# Patient Record
Sex: Male | Born: 1990 | Race: White | Hispanic: No | Marital: Married | Smoking: Never smoker
Health system: Southern US, Community
[De-identification: ages and names within clinical notes are randomized; demographics above are authoritative.]

## PROBLEM LIST (undated history)

## (undated) DIAGNOSIS — F4320 Adjustment disorder, unspecified: Secondary | ICD-10-CM

## (undated) DIAGNOSIS — Z8619 Personal history of other infectious and parasitic diseases: Secondary | ICD-10-CM

## (undated) DIAGNOSIS — L709 Acne, unspecified: Secondary | ICD-10-CM

## (undated) HISTORY — DX: Personal history of other infectious and parasitic diseases: Z86.19

## (undated) HISTORY — DX: Adjustment disorder, unspecified: F43.20

## (undated) HISTORY — DX: Acne, unspecified: L70.9

## (undated) HISTORY — PX: NO PAST SURGERIES: SHX2092

---
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
## 2019-08-20 ENCOUNTER — Other Ambulatory Visit: Payer: Self-pay

## 2019-08-20 ENCOUNTER — Emergency Department (HOSPITAL_COMMUNITY)
Admission: EM | Admit: 2019-08-20 | Discharge: 2019-08-20 | Disposition: A | Discharge status: Home / Self Care | Payer: No Typology Code available for payment source | Attending: Emergency Medicine | Admitting: Emergency Medicine

## 2019-08-20 ENCOUNTER — Emergency Department (HOSPITAL_COMMUNITY): Payer: No Typology Code available for payment source

## 2019-08-20 ENCOUNTER — Encounter (HOSPITAL_COMMUNITY): Payer: Self-pay | Admitting: Emergency Medicine

## 2019-08-20 DIAGNOSIS — Y990 Civilian activity done for income or pay: Secondary | ICD-10-CM | POA: Insufficient documentation

## 2019-08-20 DIAGNOSIS — Y9389 Activity, other specified: Secondary | ICD-10-CM | POA: Diagnosis not present

## 2019-08-20 DIAGNOSIS — S81811A Laceration without foreign body, right lower leg, initial encounter: Secondary | ICD-10-CM | POA: Diagnosis not present

## 2019-08-20 DIAGNOSIS — Z23 Encounter for immunization: Secondary | ICD-10-CM | POA: Insufficient documentation

## 2019-08-20 DIAGNOSIS — Z87891 Personal history of nicotine dependence: Secondary | ICD-10-CM | POA: Insufficient documentation

## 2019-08-20 DIAGNOSIS — W540XXA Bitten by dog, initial encounter: Secondary | ICD-10-CM | POA: Diagnosis not present

## 2019-08-20 DIAGNOSIS — Y9289 Other specified places as the place of occurrence of the external cause: Secondary | ICD-10-CM | POA: Insufficient documentation

## 2019-08-20 DIAGNOSIS — S81851A Open bite, right lower leg, initial encounter: Secondary | ICD-10-CM

## 2019-08-20 DIAGNOSIS — S71111A Laceration without foreign body, right thigh, initial encounter: Secondary | ICD-10-CM | POA: Insufficient documentation

## 2019-08-20 MED ORDER — LIDOCAINE-EPINEPHRINE (PF) 2 %-1:200000 IJ SOLN
20.0000 mL | Freq: Once | INTRAMUSCULAR | Status: AC
  Administered 2019-08-20: 20 mL via INTRADERMAL
  Filled 2019-08-20: qty 20

## 2019-08-20 MED ORDER — AMOXICILLIN-POT CLAVULANATE 875-125 MG PO TABS: 1.0000 | ORAL_TABLET | Freq: Two times a day (BID) | ORAL | 0 refills | Status: DC

## 2019-08-20 MED ORDER — IBUPROFEN 600 MG PO TABS: 600.0000 mg | ORAL_TABLET | Freq: Four times a day (QID) | ORAL | 0 refills | Status: DC | PRN

## 2019-08-20 MED ORDER — AMOXICILLIN-POT CLAVULANATE 875-125 MG PO TABS
1.0000 | ORAL_TABLET | Freq: Once | ORAL | Status: AC
  Administered 2019-08-20: 22:00:00 1 via ORAL
  Filled 2019-08-20: qty 1

## 2019-08-20 MED ORDER — TETANUS-DIPHTH-ACELL PERTUSSIS 5-2.5-18.5 LF-MCG/0.5 IM SUSP
0.5000 mL | Freq: Once | INTRAMUSCULAR | Status: AC
  Administered 2019-08-20: 19:00:00 0.5 mL via INTRAMUSCULAR
  Filled 2019-08-20: qty 0.5
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
## 2019-08-20 NOTE — Discharge Instructions (Signed)
1. Medications: Please take all of your antibiotics until finished!   Take your antibiotics with food.  Common side effects of antibiotics include nausea, vomiting, abdominal discomfort, and diarrhea. You may help offset some of this with probiotics which you can buy or get in yogurt. Do not eat  or take the probiotics until 2 hours after your antibiotic.    Alternate 600 mg of ibuprofen and [phone] mg of Tylenol every 3-6 hours as needed for pain. Do not exceed 4000 mg of Tylenol daily.  Take ibuprofen with food to avoid upset stomach issues. 2. Treatment: ice for swelling, elevate the extremity when not walking on it, keep wound clean with warm soap and water and keep bandage dry, do not submerge in water for 24 hours, pat the area dry after showers but do not rub it. Change dressing twice daily.  3. Follow Up: Please return in 10 days to have your stitches/staples removed or sooner if you have concerns.  You may also go to urgent care or your primary care physician.  Return to the emergency department sooner if any concerning signs or symptoms develop such as high fevers, redness, drainage of pus from the wound, or swelling.   WOUND CARE  Keep area clean and dry for 24 hours. Do not remove bandage, if applied.  After 24 hours, remove bandage and wash wound gently with mild soap and warm water. Reapply a new bandage after cleaning wound, if directed.   Continue daily cleansing with soap and water until stitches/staples are removed.  Do not apply any ointments or creams to the wound while stitches/staples are in place, as this may cause delayed healing. Return if you experience any of the following signs of infection: Swelling, redness, pus drainage, streaking, fever >101.0 F  Return if you experience excessive bleeding that does not stop after 15-20 minutes of constant, firm pressure.

## 2019-08-20 NOTE — ED Triage Notes (Signed)
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 Animal control was called out and the dog is UTD on shots.

## 2020-02-14 ENCOUNTER — Telehealth: Payer: Self-pay | Admitting: General Practice

## 2020-02-14 NOTE — Telephone Encounter (Signed)
It is okay, but I will not be able to see him until October.  If he needs something immediately, needs to go to urgent care.

## 2020-02-14 NOTE — Telephone Encounter (Signed)
Patient would like to establish care with you    Please advise

## 2020-04-04 ENCOUNTER — Ambulatory Visit: Payer: Managed Care, Other (non HMO) | Admitting: Internal Medicine

## 2020-04-24 ENCOUNTER — Encounter: Payer: Self-pay | Admitting: Internal Medicine

## 2020-04-24 ENCOUNTER — Ambulatory Visit: Payer: Managed Care, Other (non HMO) | Admitting: Internal Medicine

## 2020-04-24 ENCOUNTER — Other Ambulatory Visit: Payer: Self-pay

## 2020-04-24 VITALS — BP 113/65 | HR 72 | Temp 97.8°F | Resp 16 | Ht 70.0 in | Wt 202.1 lb

## 2020-04-24 DIAGNOSIS — Z79899 Other long term (current) drug therapy: Secondary | ICD-10-CM | POA: Diagnosis not present

## 2020-04-24 DIAGNOSIS — F988 Other specified behavioral and emotional disorders with onset usually occurring in childhood and adolescence: Secondary | ICD-10-CM | POA: Diagnosis not present

## 2020-04-24 HISTORY — DX: Other specified behavioral and emotional disorders with onset usually occurring in childhood and adolescence: F98.8

## 2020-04-24 NOTE — Patient Instructions (Addendum)
Please bring your childhood immunizations with you to your next visit.    GO TO THE LAB : Provide a urine sample   GO TO THE FRONT DESK, PLEASE SCHEDULE YOUR APPOINTMENTS  You can schedule an appointment to see one of the [facility]  counselors to get confirmation of your ADD.  Come back for a physical exam in 3 months.

## 2020-04-24 NOTE — Progress Notes (Signed)
Subjective:    Patient ID: Kevin Figueroa, male    DOB: [birth year], 29 y.o.   MRN: [id number]  DOS:  04/24/2020 Type of visit - description: New patient His main concern is ADD treatment. Patient tells me that he was diagnosed when he was at the elementary school after a psychology evaluation. He took Adderall without side effects for several years. Eventually he stopped was not able to get back on it mostly due to lack of insurance. After high school he did 1 year of community college he missed having Adderall on hand. He currently is a Music therapist and that does not require any medication but he is currently taking online IT courses and he does need something to focus.   Review of Systems He denies depression or anxiety. No insomnia    Past Medical History:  Diagnosis Date  . No pertinent past medical history     Past Surgical History:  Procedure Laterality Date  . NO PAST SURGERIES      Allergies as of 04/24/2020   No Known Allergies     Medication List       Accurate as of April 24, 2020 11:09 AM. If you have any questions, ask your nurse or doctor.        amoxicillin-clavulanate 875-125 MG tablet Commonly known as: AUGMENTIN Take 1 tablet by mouth every 12 (twelve) hours.   ibuprofen 600 MG tablet Commonly known as: ADVIL Take 1 tablet (600 mg total) by mouth every 6 (six) hours as needed.      Family History  Problem Relation Age of Onset  . Colon cancer Neg Hx   . Prostate cancer Neg Hx    Family History  Problem Relation Age of Onset  . Colon cancer Neg Hx   . Prostate cancer Neg Hx         Objective:   Physical Exam BP 113/65 (BP Location: Right Arm, Patient Position: Sitting, Cuff Size: Normal)   Pulse 72   Temp 97.8 F (36.6 C) (Oral)   Resp 16   Ht 5\' 10"  (1.778 m)   Wt 202 lb 2 oz (91.7 kg)   SpO2 100%   BMI 29.00 kg/m  General:   Well developed, NAD, BMI noted. HEENT:  Normocephalic . Face symmetric, atraumatic Lungs:    CTA B Normal respiratory effort, no intercostal retractions, no accessory muscle use. Heart: RRR,  no murmur.  Lower extremities: no pretibial edema bilaterally  Skin: Not pale. Not jaundice Neurologic:  alert & oriented X3.  Speech normal, gait appropriate for age and unassisted Psych--  Cognition and judgment appear intact.  Cooperative with normal attention span and concentration.  Behavior appropriate. No anxious or depressed appearing.      Assessment    Assessment (new patient 04-2020 ADD  Plan: ADD: History of ADD, diagnosed in elementary school, took Adderall for years through elementary middle school.  See details in the HPI. Reports that his last physician Dr. 05-2020 in [facility] was reluctant to prescribe Adderall and prescribed an antidepressant which made him sleepy. PDMP reviewed and is empty Plan: Get records from previous doctors Asked him to get evaluated by one of our counselors to confirm ADD diagnosis Contract and UDS today Start prescribing Adderall as requested by the patient once the DX is confirmed. Addendum: Multiple records reviewed RTC 3 months   This visit occurred during the SARS-CoV-2 public health emergency.  Safety protocols were in place, including screening questions prior to  the visit, additional usage of staff PPE, and extensive cleaning of exam room while observing appropriate contact time as indicated for disinfecting solutions.

## 2020-04-24 NOTE — Progress Notes (Signed)
Pre visit review using our clinic review tool, if applicable. No additional management support is needed unless otherwise documented below in the visit note. 

## 2020-04-25 ENCOUNTER — Telehealth: Payer: Self-pay

## 2020-04-25 NOTE — Telephone Encounter (Signed)
Medical records received from Dr. Excell Seltzer in [place], [place]. Records placed in PCP red folder for review.

## 2020-04-25 NOTE — Telephone Encounter (Signed)
Records sent for scanning

## 2020-04-25 NOTE — Telephone Encounter (Signed)
Records reviewed:  In 2011 was prescribed Adderall.  In 2014, 2015 was diagnosed with anxiety and depression. Was prescribed Adderall at the time.  Saw Dr. Excell Seltzer in 2021, initially prescribed Strattera, it did not work for him, felt sleepy. He then prescribed escitalopram.  Social history: No history of alcohol on tobacco use.  Plan: Pending psychologist evaluation I am willing to prescribe Adderall

## 2020-04-26 LAB — DRUG MONITORING, PANEL 8 WITH CONFIRMATION, URINE
6 Acetylmorphine: NEGATIVE ng/mL (ref <10)
Alcohol Metabolites: NEGATIVE ng/mL
Amphetamines: NEGATIVE ng/mL (ref <500)
Benzodiazepines: NEGATIVE ng/mL (ref <100)
Buprenorphine, Urine: NEGATIVE ng/mL (ref <5)
Cocaine Metabolite: NEGATIVE ng/mL (ref <150)
Creatinine: 185.2 mg/dL
MDMA: NEGATIVE ng/mL (ref <500)
Marijuana Metabolite: >5000 ng/mL — ABNORMAL HIGH (ref <5)
Marijuana Metabolite: POSITIVE ng/mL — AB (ref <20)
Opiates: NEGATIVE ng/mL (ref <100)
Oxidant: NEGATIVE ug/mL
Oxycodone: NEGATIVE ng/mL (ref <100)
pH: 7.4 (ref 4.5–9.0)

## 2020-04-26 LAB — DM TEMPLATE

## 2020-07-25 ENCOUNTER — Ambulatory Visit: Payer: Managed Care, Other (non HMO) | Admitting: Internal Medicine

## 2020-09-19 ENCOUNTER — Encounter: Payer: Self-pay | Admitting: Internal Medicine

## 2021-05-04 IMAGING — CR DG TIBIA/FIBULA 2V*R*
4 series · 4 of 4 positions shown · non-contrast
Comparison: None.

CLINICAL DATA: Dog bite, open wound medial calf

EXAM:
RIGHT TIBIA AND FIBULA - 2 VIEW

[x tib-fib ap right (1 of 2)]
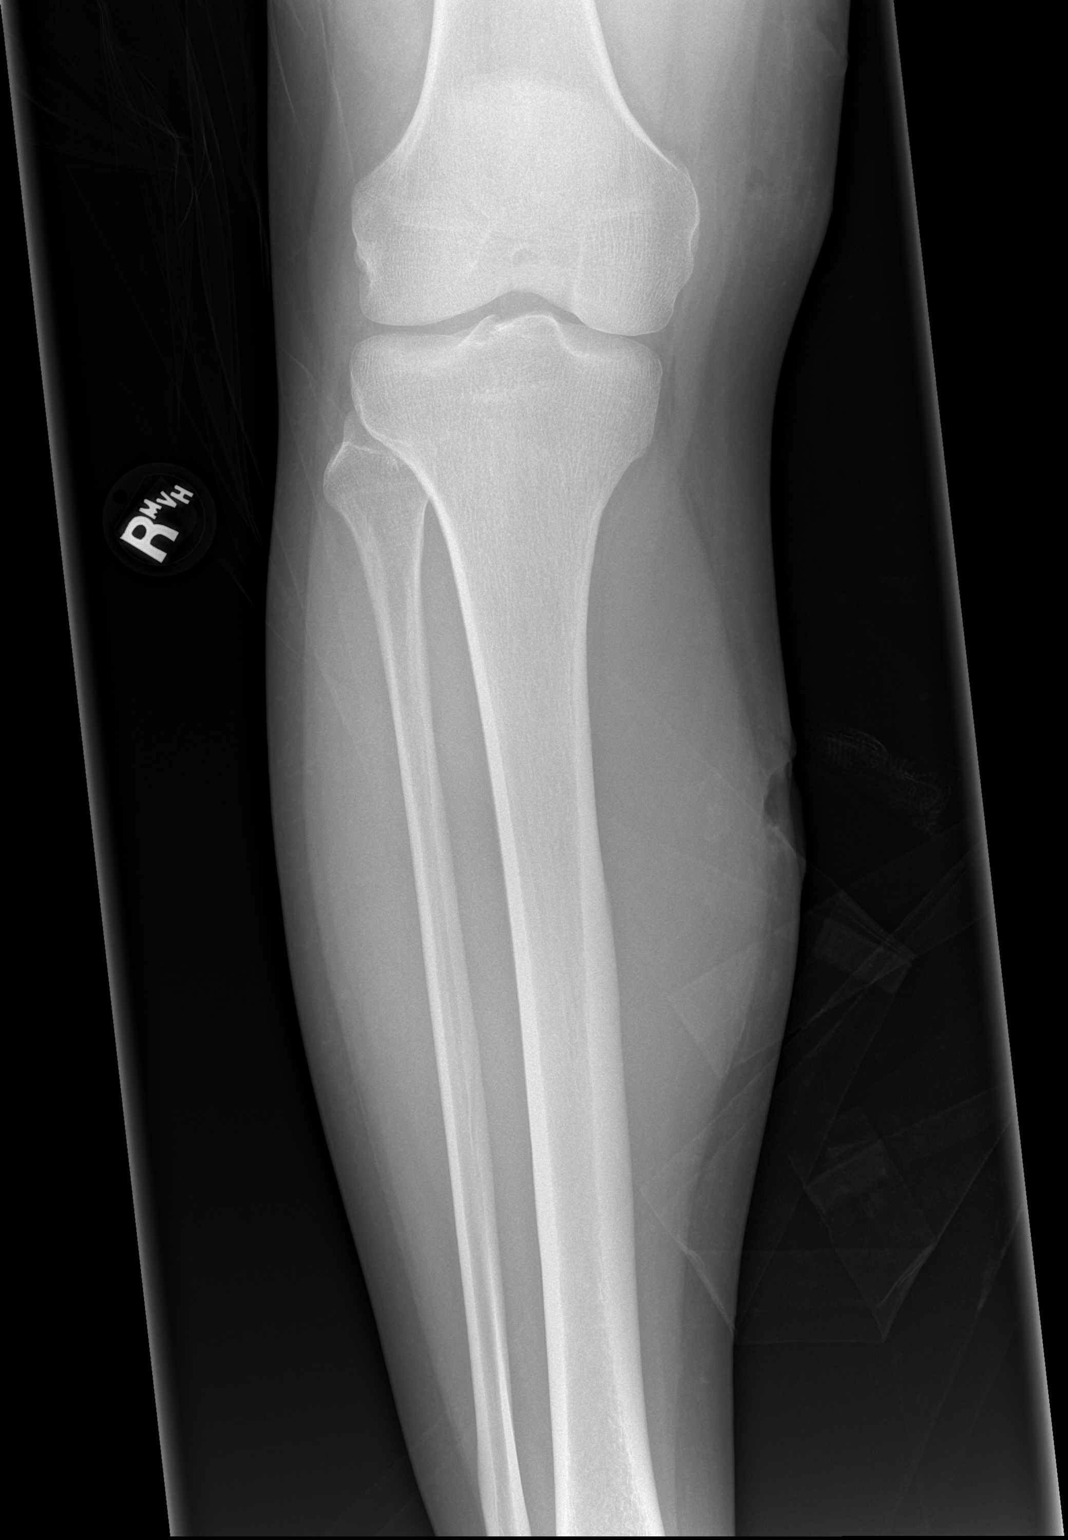

[x tib-fib ap right (2 of 2)]
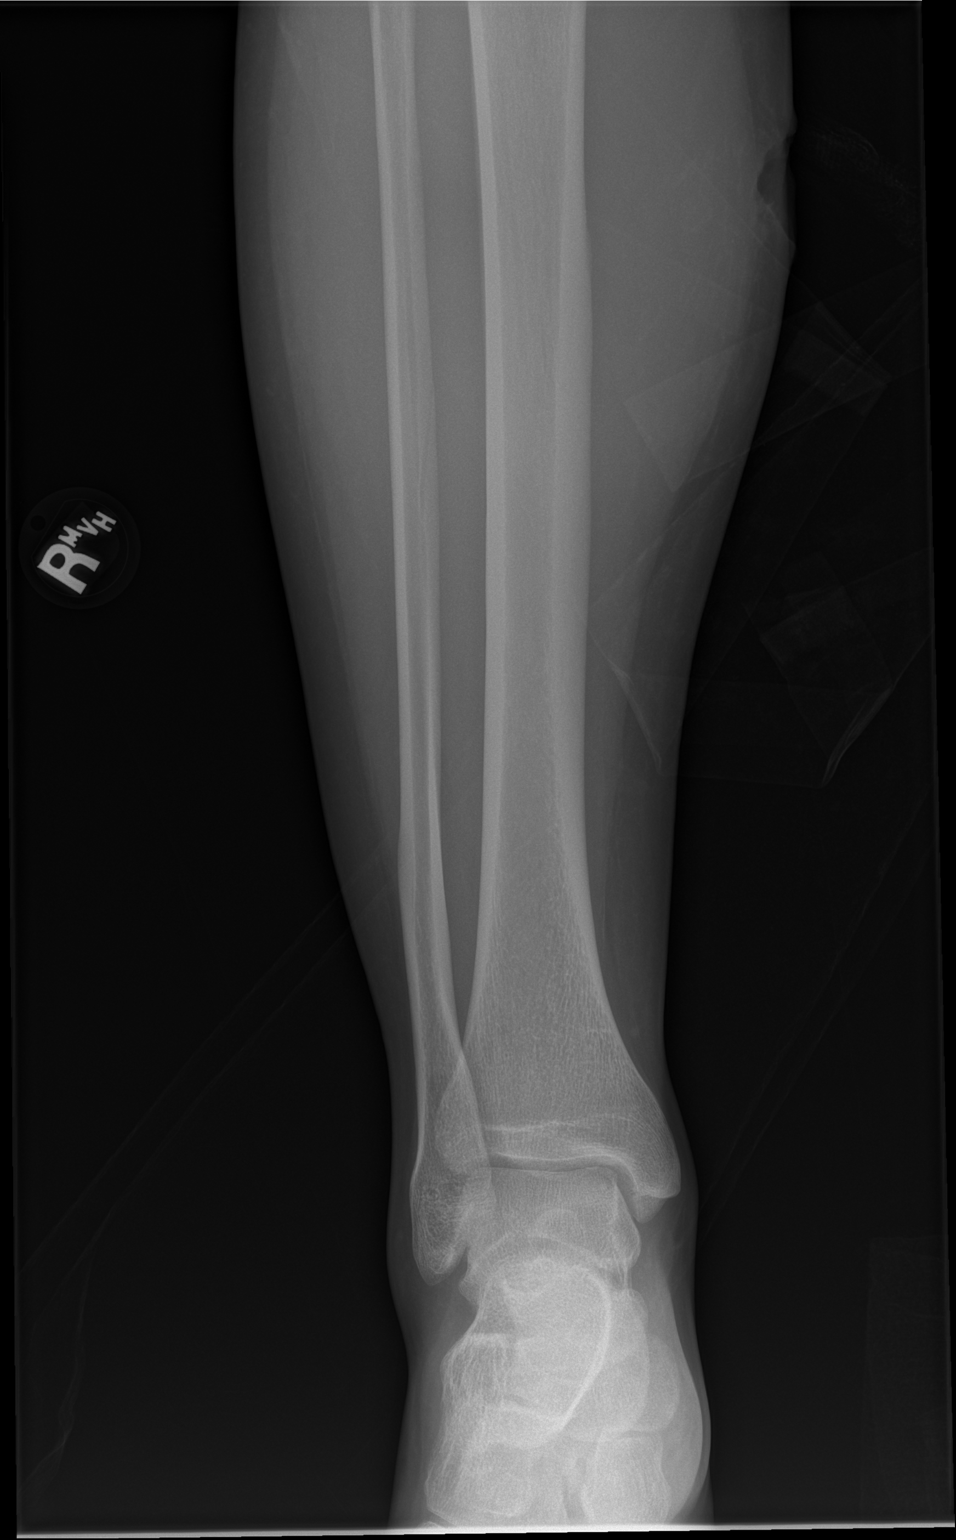

[x tib-fib lat right (1 of 2)]
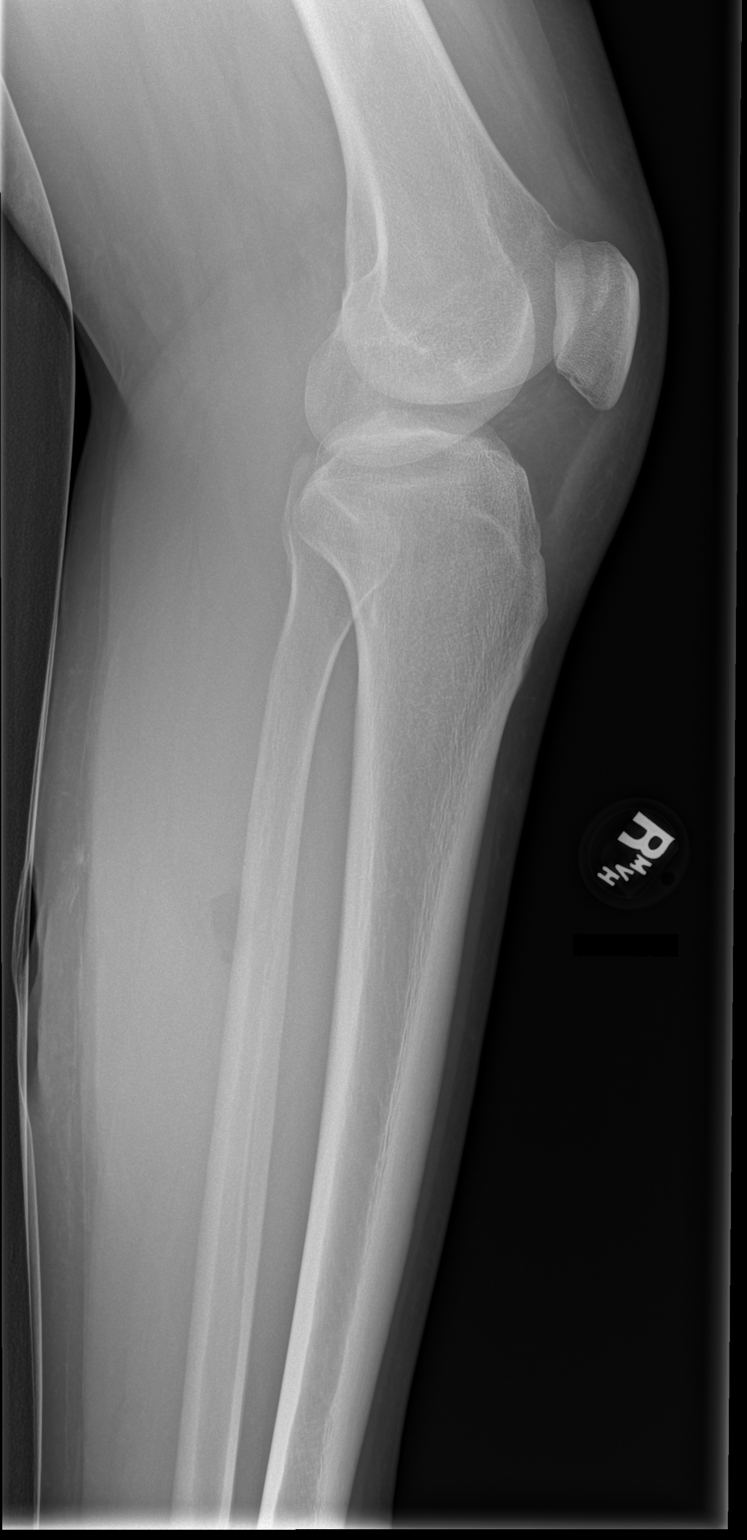

[x tib-fib lat right (2 of 2)]
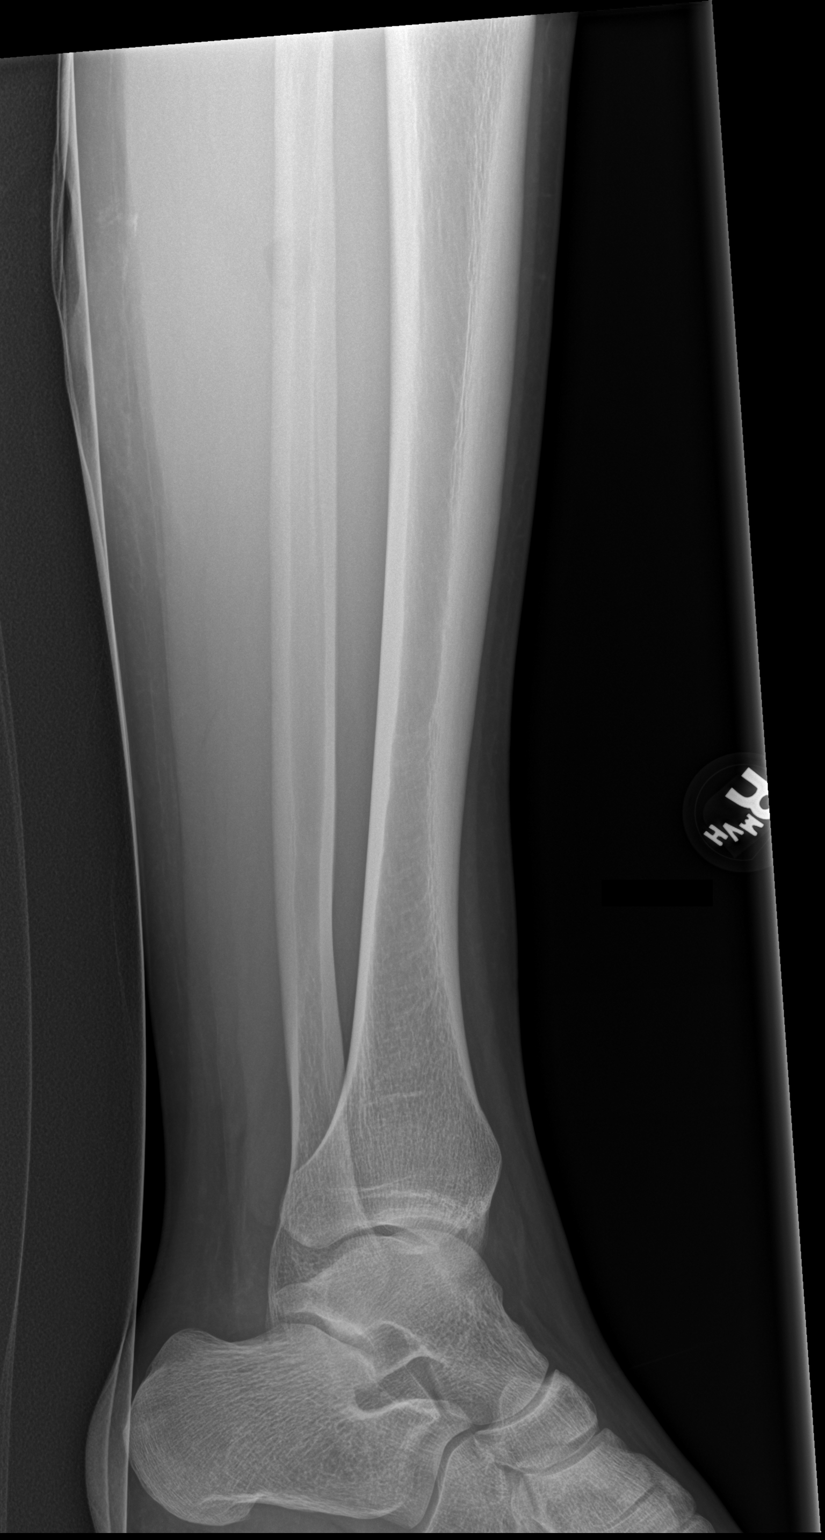

[4 of 4 positions shown; findings below may reference images not displayed]

FINDINGS: Frontal and lateral views of the right tibia and fibula are
obtained. Soft tissue defect within the proximal medial right calf.
No underlying fracture or radiopaque foreign body. Right knee and
ankle are well aligned.
IMPRESSION: 1. Soft tissue defect proximal medial right calf. No fracture or
radiopaque foreign body.

## 2021-05-04 IMAGING — CR DG FEMUR 1V*R*
2 series · 2 of 2 positions shown · non-contrast
Comparison: None.

CLINICAL DATA: Dog bite

EXAM:
RIGHT FEMUR 1 VIEW

[x femur proximal ap right]
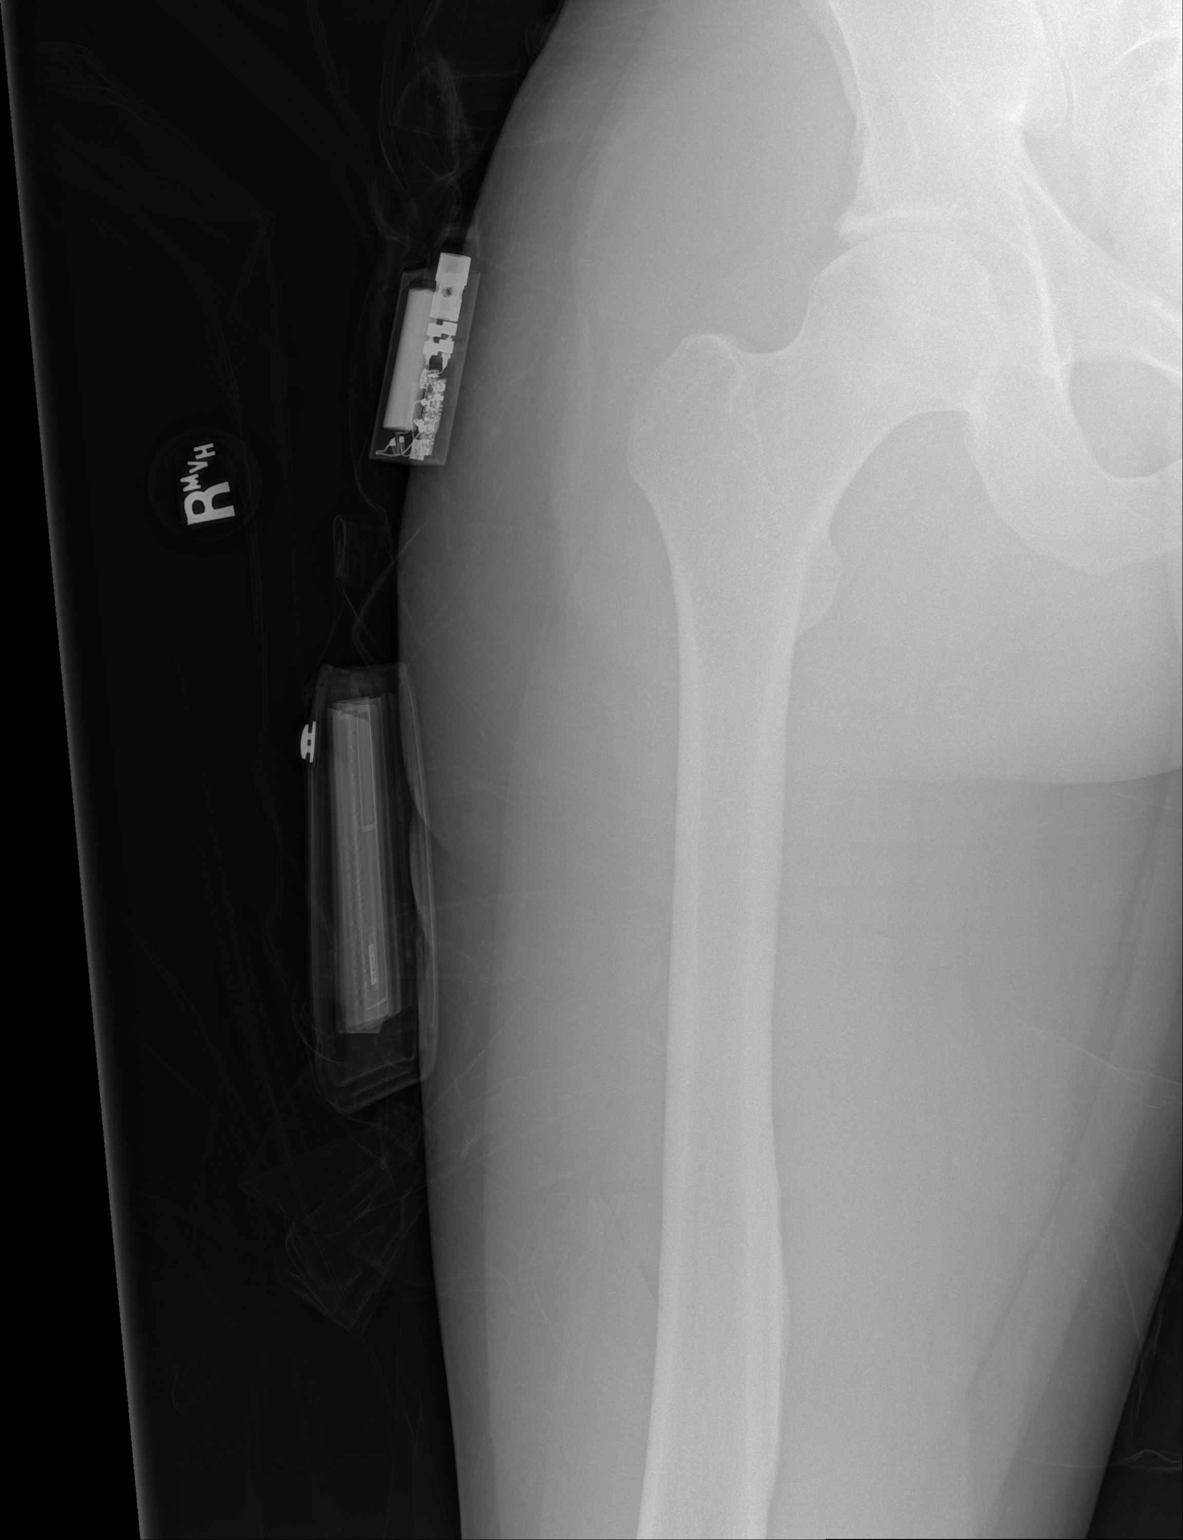

[x femur distal ap right]
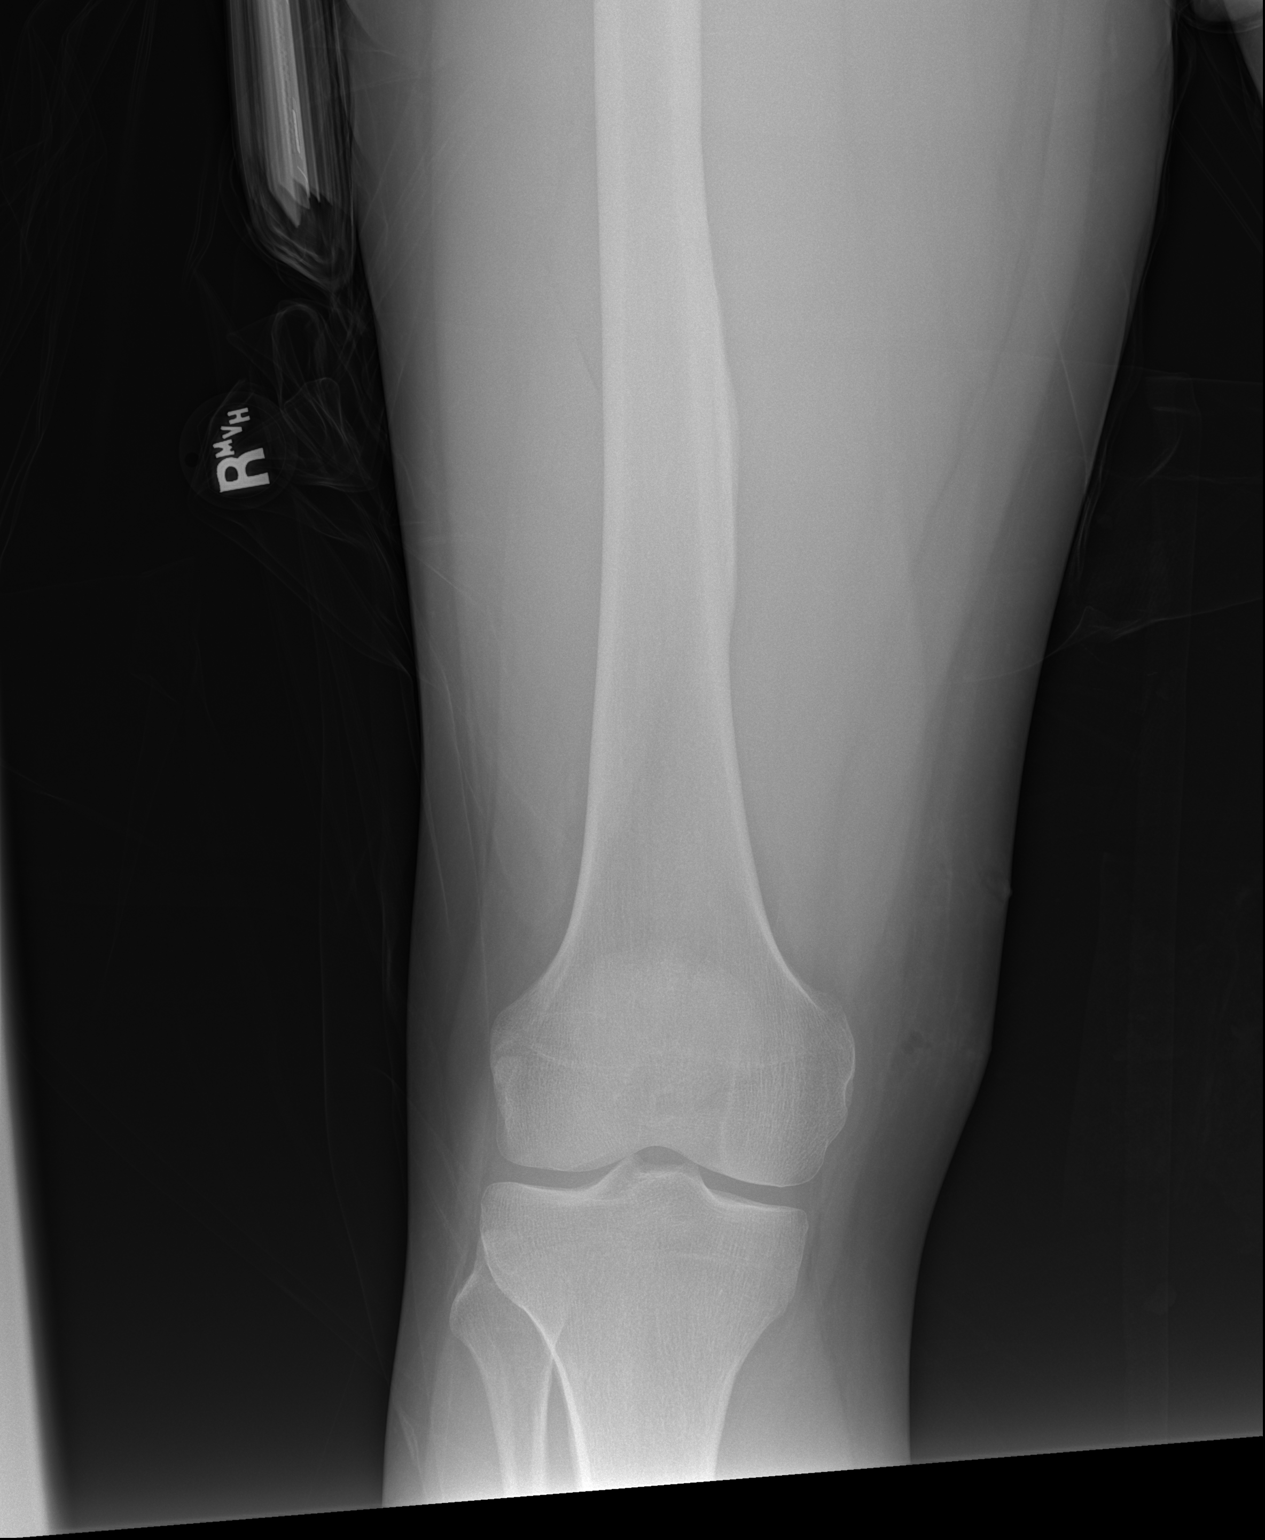

[2 of 2 positions shown; findings below may reference images not displayed]

FINDINGS: No acute fracture or dislocation. No aggressive osseous lesion. Two
small wounds along the medial aspect of the distal femur. No
radiopaque foreign body.
IMPRESSION: Two small wounds along the medial aspect of the distal femur.
